# Patient Record
Sex: Female | Born: 1960 | Race: White | Hispanic: No | Marital: Married | Smoking: Former smoker
Health system: Southern US, Community
[De-identification: ages and names within clinical notes are randomized; demographics above are authoritative.]

## PROBLEM LIST (undated history)

## (undated) DIAGNOSIS — I10 Essential (primary) hypertension: Secondary | ICD-10-CM

## (undated) HISTORY — PX: TONSILLECTOMY: SUR1361

## (undated) HISTORY — DX: Essential (primary) hypertension: I10

---

## 2011-07-27 ENCOUNTER — Ambulatory Visit: Payer: Self-pay | Admitting: Family Medicine

## 2012-07-04 LAB — HM PAP SMEAR: HM Pap smear: NORMAL

## 2013-05-22 ENCOUNTER — Encounter: Payer: Self-pay | Admitting: Adult Health

## 2013-05-22 ENCOUNTER — Ambulatory Visit: Payer: Self-pay | Admitting: Adult Health

## 2013-05-22 ENCOUNTER — Ambulatory Visit (INDEPENDENT_AMBULATORY_CARE_PROVIDER_SITE_OTHER): Payer: BC Managed Care – PPO | Admitting: Adult Health

## 2013-05-22 VITALS — BP 136/70 | HR 98 | Temp 98.6°F | Resp 12 | Ht 64.5 in | Wt 131.5 lb

## 2013-05-22 DIAGNOSIS — F17200 Nicotine dependence, unspecified, uncomplicated: Secondary | ICD-10-CM

## 2013-05-22 DIAGNOSIS — Z1211 Encounter for screening for malignant neoplasm of colon: Secondary | ICD-10-CM | POA: Insufficient documentation

## 2013-05-22 DIAGNOSIS — Z0000 Encounter for general adult medical examination without abnormal findings: Secondary | ICD-10-CM

## 2013-05-22 DIAGNOSIS — Z1239 Encounter for other screening for malignant neoplasm of breast: Secondary | ICD-10-CM

## 2013-05-22 MED ORDER — NORETHINDRONE ACETATE 5 MG PO TABS: 5.0000 mg | ORAL_TABLET | Freq: Every day | ORAL | Status: DC

## 2013-05-22 NOTE — Assessment & Plan Note (Signed)
Refer to GI for screening colonoscopy 

## 2013-05-22 NOTE — Assessment & Plan Note (Addendum)
Smokes approximately one pack per day. She is in the process of trying to quit smoking. Discussed options available to help should she find this necessary. She is currently going through a counseling/encouraging program to help her quit. Continue to encourage smoking cessation.

## 2013-05-22 NOTE — Patient Instructions (Addendum)
   Thank you for choosing [place] [facility] for your health care needs.  Please schedule appointment to have fasting labs at your earliest convenience.  The results will be available through MyChart for your convenience. Please remember to activate this. The activation code is located at the end of this form.  I am referring you to GI for your screening colonoscopy.  I have sent in prescription to [facility] for you.  Please schedule your Mammogram for October.  Please send me information of your immunizations - tetanus, pneumonia, flu

## 2013-05-22 NOTE — Progress Notes (Signed)
Subjective:    Patient ID: Marcia Rodriguez, female    DOB: 02-14-1961, 52 y.o.   MRN: [id number]  HPI  Patient is a very pleasant 52 year old female who presents to clinic to establish care and for a routine physical. She has recently had a breast and pelvic exam by her OB/GYN. These were completed in October 2013. Patient has not had a regular PCP. Most of her care was being provided through her OB/GYN. We will request medical records. Patient is overall feeling very well. She has no concerns this visit. She is a in every day smoker and is interested in quitting. She is currently undergoing counseling for smoking cessation.   History reviewed. No pertinent past medical history.   History reviewed. No pertinent past surgical history.   Family History  Problem Relation Age of Onset  . COPD Mother   . Heart disease Father 43    Died heart dz  . Diabetes Brother      History   Social History  . Marital Status: Married    Spouse Name: Latausha Flamm    Number of Children: 2  . Years of Education: 12   Occupational History  . Triage Nurse     [employer]   Social History Main Topics  . Smoking status: Current Every Day Smoker -- 1.00 packs/day for 37 years    Types: Cigarettes  . Smokeless tobacco: Never Used  . Alcohol Use: 8.4 oz/week    14 Cans of beer per week     Comment: 2 drinks daily  . Drug Use: No  . Sexual Activity: Not on file   Other Topics Concern  . Not on file   Social History Narrative   Kendall grew up in [place]. She is married. They have 2 daughters Mare Loan, age 75; Carollee Herter, age 78). They also have a grandson (Braden age 39). She lives at home with husband, Channing Mutters, of 32 years and their daughter Carollee Herter and grandson, Braden They have a boxer named Duke. 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 Elzina enjoys traveling and playing with her grandson.     Review of Systems  Constitutional: Negative.   HENT: Negative.   Eyes:  Negative.        Yearly eye exam. Last exam 2013.  Respiratory: Negative.   Cardiovascular: Negative.   Gastrointestinal: Negative.   Endocrine: Negative.   Genitourinary: Negative.   Musculoskeletal: Negative.   Skin: Negative.   Allergic/Immunologic:       Mild seasonal allergies  Neurological: Negative.   Hematological: Negative.   Psychiatric/Behavioral: Negative.     BP 136/70  Pulse 98  Temp(Src) 98.6 F (37 C) (Oral)  Resp 12  Ht 5' 4.5" (1.638 m)  Wt 131 lb 8 oz (59.648 kg)  BMI 22.23 kg/m2  SpO2 97%     Objective:   Physical Exam  Constitutional: She is oriented to person, place, and time. She appears well-developed and well-nourished. No distress.  HENT:  Head: Normocephalic and atraumatic.  Right Ear: External ear normal.  Left Ear: External ear normal.  Nose: Nose normal.  Mouth/Throat: Oropharynx is clear and moist.  Eyes: Conjunctivae and EOM are normal. Pupils are equal, round, and reactive to light.  Neck: Normal range of motion. Neck supple. No tracheal deviation present. No thyromegaly present.  Cardiovascular: Normal rate, regular rhythm, normal heart sounds and intact distal pulses.  Exam reveals no gallop and no friction rub.   No murmur heard. Pulmonary/Chest:  Effort normal and breath sounds normal. No respiratory distress. She has no wheezes. She has no rales. She exhibits no tenderness.  Abdominal: Soft. She exhibits no distension and no mass. There is no tenderness. There is no rebound and no guarding.  Musculoskeletal: Normal range of motion. She exhibits no edema and no tenderness.  Lymphadenopathy:    She has no cervical adenopathy.  Neurological: She is alert and oriented to person, place, and time. She has normal reflexes. She displays no tremor and normal reflexes. No cranial nerve deficit. She exhibits normal muscle tone. She displays a negative Romberg sign. Coordination and gait normal.  Reflex Scores:      Brachioradialis reflexes are  2+ on the right side and 2+ on the left side.      Patellar reflexes are 2+ on the right side and 2+ on the left side. Skin: Skin is warm and dry.  Psychiatric: She has a normal mood and affect. Her behavior is normal. Judgment and thought content normal.       Assessment & Plan:

## 2013-05-22 NOTE — Assessment & Plan Note (Signed)
Normal physical exam excluding breast and pelvic. Labs: CBC with differential, basic metabolic panel, hepatic panel, lipids, TSH, vitamin D, and B12. Mammography due in October. Screening colonoscopy - refer to GI.

## 2013-05-22 NOTE — Assessment & Plan Note (Signed)
Mammogram scheduled for October 2014. Patient will call to schedule her own appointment.

## 2013-05-28 ENCOUNTER — Other Ambulatory Visit: Payer: BC Managed Care – PPO

## 2013-07-03 ENCOUNTER — Telehealth: Payer: Self-pay | Admitting: Adult Health

## 2013-07-03 NOTE — Telephone Encounter (Signed)
Left message for pt, advising Rx was sent 05/22/13 with a year of refills to [facility] and to check with them regarding her refill, telephone number given.

## 2013-07-03 NOTE — Telephone Encounter (Signed)
Pt called to get refill on Norethindrone acet tab 5mg  [facility] Pt has about 1 week left Pt didn't know number [facility]

## 2013-08-08 ENCOUNTER — Ambulatory Visit: Payer: Self-pay | Admitting: Gastroenterology

## 2013-09-01 ENCOUNTER — Encounter: Payer: Self-pay | Admitting: Adult Health

## 2014-06-05 ENCOUNTER — Other Ambulatory Visit: Payer: Self-pay | Admitting: Adult Health

## 2014-06-09 ENCOUNTER — Telehealth: Payer: Self-pay

## 2014-06-09 NOTE — Telephone Encounter (Signed)
Patient called requesting a Rx refill for Aygestin. Patient last seen in office 05/22/13. She has appt schedule to see Dr. Darrick Huntsman 07/24/14 for a physical but she will be out of medication before that appt. Is it ok to refill? Please advise.

## 2014-06-10 ENCOUNTER — Other Ambulatory Visit: Payer: Self-pay | Admitting: *Deleted

## 2014-06-10 MED ORDER — NORETHINDRONE ACETATE 5 MG PO TABS: 5.0000 mg | ORAL_TABLET | Freq: Every day | ORAL | Status: AC

## 2014-06-10 NOTE — Telephone Encounter (Signed)
Refill only to cover until appointment. Thanks

## 2014-07-24 ENCOUNTER — Encounter: Payer: BC Managed Care – PPO | Admitting: Internal Medicine

## 2015-03-02 ENCOUNTER — Ambulatory Visit
Admission: RE | Admit: 2015-03-02 | Discharge: 2015-03-02 | Disposition: A | Discharge status: Home / Self Care | Payer: BLUE CROSS/BLUE SHIELD | Source: Ambulatory Visit | Attending: Hematology and Oncology | Admitting: Hematology and Oncology

## 2015-03-02 ENCOUNTER — Ambulatory Visit: Payer: BLUE CROSS/BLUE SHIELD

## 2015-03-02 ENCOUNTER — Inpatient Hospital Stay: Payer: BLUE CROSS/BLUE SHIELD | Attending: Hematology and Oncology | Admitting: Hematology and Oncology

## 2015-03-02 ENCOUNTER — Other Ambulatory Visit: Payer: Self-pay

## 2015-03-02 ENCOUNTER — Encounter (INDEPENDENT_AMBULATORY_CARE_PROVIDER_SITE_OTHER): Payer: Self-pay

## 2015-03-02 ENCOUNTER — Inpatient Hospital Stay: Payer: BLUE CROSS/BLUE SHIELD

## 2015-03-02 VITALS — BP 134/90 | HR 89 | Temp 95.5°F | Ht 64.0 in | Wt 137.3 lb

## 2015-03-02 DIAGNOSIS — D751 Secondary polycythemia: Secondary | ICD-10-CM | POA: Insufficient documentation

## 2015-03-02 DIAGNOSIS — F17200 Nicotine dependence, unspecified, uncomplicated: Secondary | ICD-10-CM | POA: Diagnosis present

## 2015-03-02 DIAGNOSIS — F17210 Nicotine dependence, cigarettes, uncomplicated: Secondary | ICD-10-CM | POA: Diagnosis not present

## 2015-03-02 DIAGNOSIS — Z79899 Other long term (current) drug therapy: Secondary | ICD-10-CM | POA: Diagnosis not present

## 2015-03-02 DIAGNOSIS — I10 Essential (primary) hypertension: Secondary | ICD-10-CM | POA: Insufficient documentation

## 2015-03-02 DIAGNOSIS — R05 Cough: Secondary | ICD-10-CM | POA: Diagnosis present

## 2015-03-02 LAB — CBC WITH DIFFERENTIAL/PLATELET
Basophils Absolute: 0 10*3/uL (ref 0–0.1)
Basophils Relative: 1 %
Eosinophils Absolute: 0.2 10*3/uL (ref 0–0.7)
Eosinophils Relative: 2 %
HCT: 48.6 % — ABNORMAL HIGH (ref 35.0–47.0)
Hemoglobin: 16 g/dL (ref 12.0–16.0)
Lymphocytes Relative: 31 %
Lymphs Abs: 2.6 10*3/uL (ref 1.0–3.6)
MCH: 31.9 pg (ref 26.0–34.0)
MCHC: 32.8 g/dL (ref 32.0–36.0)
MCV: 97.1 fL (ref 80.0–100.0)
Monocytes Absolute: 0.7 10*3/uL (ref 0.2–0.9)
Monocytes Relative: 8 %
Neutro Abs: 4.8 10*3/uL (ref 1.4–6.5)
Neutrophils Relative %: 58 %
Platelets: 217 10*3/uL (ref 150–440)
RBC: 5.01 MIL/uL (ref 3.80–5.20)
RDW: 13.6 % (ref 11.5–14.5)
WBC: 8.3 10*3/uL (ref 3.6–11.0)

## 2015-03-02 LAB — URINALYSIS COMPLETE WITH MICROSCOPIC (ARMC ONLY)
Bacteria, UA: NONE SEEN
Bilirubin Urine: NEGATIVE
Glucose, UA: NEGATIVE mg/dL
Ketones, ur: NEGATIVE mg/dL
Leukocytes, UA: NEGATIVE
Nitrite: NEGATIVE
Protein, ur: NEGATIVE mg/dL
Specific Gravity, Urine: 1.012 (ref 1.005–1.030)
pH: 6 (ref 5.0–8.0)

## 2015-03-02 NOTE — Progress Notes (Signed)
Pt here today for initial consult referred by Dr. Thedore MinsSingh for increased HGB; offers no complaints

## 2015-03-03 LAB — MISC LABCORP TEST (SEND OUT)
LabCorp test name: 7187
Labcorp test code: 7187
Source (LabCorp): 7187

## 2015-03-03 LAB — ERYTHROPOIETIN: Erythropoietin: 8.3 m[IU]/mL (ref 2.6–18.5)

## 2015-03-08 LAB — MISC LABCORP TEST (SEND OUT)

## 2015-03-16 ENCOUNTER — Inpatient Hospital Stay: Payer: BLUE CROSS/BLUE SHIELD | Admitting: Hematology and Oncology

## 2015-03-16 ENCOUNTER — Encounter: Payer: Self-pay | Admitting: Hematology and Oncology

## 2015-03-16 NOTE — Progress Notes (Signed)
[facility]-  Cancer Center  Clinic day:  03/02/2015  Chief Complaint: Marcia Rodriguez is a 54 y.o. female with an elevated hemoglobin who is referred in consultation by Dr. Thedore Mins assessment and management.  HPI: Patient has been followed by Dr. Thedore Mins for the past 1-2 years. She has a 1 pack per day smoking history for the past 30 years. She want to cut back. She has not had a chest x-ray.  She is not sure if she may have sleep apnea. She snores. Sometimes she feels well rested in the morning.   She denies any cardiac problems.  She has never had an echocardiogram. She denies any limitations on exercise. She does not take any herbal or hormonal suppliments including testosterone.   Labs on 02/10/2015 revealed a hematocrit of 49.9, hemoglobin 16.6, MCV 99, platelets 260,000, white count 6200 with an ANC of 3270. Labs from 11/18/2014 included a hematocrit of 49.1, hemoglobin 16.2, and MCV 98.6. Comprehensive metabolic panel was normal.  Iron studies on 02/10/2015 revealed a ferritin of 38 (slightly low) and TIBC of 485 (slightly elevated).  Symptomatically, she feels good.  Diet is normal. She states that she eats meat daily. Her only issue is if she turns her head fast. She denies any erythromelalgia.  Past Medical History  Diagnosis Date  . Hypertension     Past Surgical History  Procedure Laterality Date  . Cesarean section    . Tonsillectomy      Family History  Problem Relation Age of Onset  . COPD Mother   . Heart disease Father 53    Died heart dz  . Diabetes Brother     Social History:  reports that she has been smoking Cigarettes.  She has a 37 pack-year smoking history. She has never used smokeless tobacco. She reports that she drinks about 8.4 oz of alcohol per week. She reports that she does not use illicit drugs.  The patient is alone today.  Allergies: No Known Allergies  Current Medications: Current Outpatient Prescriptions  Medication Sig  Dispense Refill  . amLODipine (NORVASC) 5 MG tablet Take by mouth.    . Aspirin-Salicylamide-Caffeine (BC HEADACHE POWDER PO) Take by mouth as needed.    Marland Kitchen estradiol (CLIMARA - DOSED IN MG/24 HR) 0.025 mg/24hr patch Place onto the skin.    Marland Kitchen norethindrone (AYGESTIN) 5 MG tablet Take 1 tablet (5 mg total) by mouth daily. For 3 weeks each month 42 tablet 0   No current facility-administered medications for this visit.    Review of Systems:  GENERAL:  Feels good.  Active.  No fevers, sweats or weight loss. PERFORMANCE STATUS (ECOG):  0 HEENT:  No visual changes, runny nose, sore throat, mouth sores or tenderness. Lungs: No shortness of breath or cough.  No hemoptysis. Cardiac:  No chest pain, palpitations, orthopnea, or PND. GI:  No nausea, vomiting, diarrhea, constipation, melena or hematochezia. GU:  No urgency, frequency, dysuria, or hematuria. Musculoskeletal:  No back pain.  No joint pain.  No muscle tenderness. Extremities:  No pain or swelling. Skin:  No rashes or skin changes. Neuro:  No headache, numbness or weakness, balance or coordination issues. Endocrine:  No diabetes, thyroid issues, hot flashes or night sweats. Psych:  No mood changes, depression or anxiety. Pain:  No focal pain. Review of systems:  All other systems reviewed and found to be negative.   Physical Exam: Blood pressure 134/90, pulse 89, temperature 95.5 F (35.3 C), temperature source Tympanic, height  5\' 4"  (1.626 m), weight 137 lb 5.6 oz (62.3 kg), SpO2 94 %. GENERAL:  Well developed, well nourished, sitting comfortably in the exam room in no acute distress. MENTAL STATUS:  Alert and oriented to person, place and time. HEAD:  Long brown hair pulled back.  Normocephalic, atraumatic, face symmetric, no Cushingoid features. EYES:  Oval glasses.  Brown eyes.  Pupils equal round and reactive to light and accomodation.  No conjunctivitis or scleral icterus. ENT:  Oropharynx clear without lesion.  Tongue normal.  Mucous membranes moist.  RESPIRATORY:  Clear to auscultation without rales, wheezes or rhonchi. CARDIOVASCULAR:  Regular rate and rhythm without murmur, rub or gallop. ABDOMEN:  Soft, non-tender, with active bowel sounds, and no hepatosplenomegaly.  No masses. SKIN:  No rashes, ulcers or lesions. EXTREMITIES: No clubbing.  No edema, no skin discoloration or tenderness.  No palpable cords. LYMPH NODES: No palpable cervical, supraclavicular, axillary or inguinal adenopathy  NEUROLOGICAL: Unremarkable. PSYCH:  Appropriate.  Appointment on 03/02/2015  Component Date Value Ref Range Status  . WBC 03/02/2015 8.3  3.6 - 11.0 K/uL Final  . RBC 03/02/2015 5.01  3.80 - 5.20 MIL/uL Final  . Hemoglobin 03/02/2015 16.0  12.0 - 16.0 g/dL Final  . HCT 05/39/7673 48.6* 35.0 - 47.0 % Final  . MCV 03/02/2015 97.1  80.0 - 100.0 fL Final  . MCH 03/02/2015 31.9  26.0 - 34.0 pg Final  . MCHC 03/02/2015 32.8  32.0 - 36.0 g/dL Final  . RDW 41/93/7902 13.6  11.5 - 14.5 % Final  . Platelets 03/02/2015 217  150 - 440 K/uL Final  . Neutrophils Relative % 03/02/2015 58   Final  . Neutro Abs 03/02/2015 4.8  1.4 - 6.5 K/uL Final  . Lymphocytes Relative 03/02/2015 31   Final  . Lymphs Abs 03/02/2015 2.6  1.0 - 3.6 K/uL Final  . Monocytes Relative 03/02/2015 8   Final  . Monocytes Absolute 03/02/2015 0.7  0.2 - 0.9 K/uL Final  . Eosinophils Relative 03/02/2015 2   Final  . Eosinophils Absolute 03/02/2015 0.2  0 - 0.7 K/uL Final  . Basophils Relative 03/02/2015 1   Final  . Basophils Absolute 03/02/2015 0.0  0 - 0.1 K/uL Final  . Erythropoietin 03/02/2015 8.3  2.6 - 18.5 mIU/mL Final   Comment: (NOTE) Performed At: [facility] [address] [place], [place] [id number] Mila Homer MD JM:[id number]   . Color, Urine 03/02/2015 STRAW* YELLOW Final  . APPearance 03/02/2015 CLEAR* CLEAR Final  . Glucose, UA 03/02/2015 NEGATIVE  NEGATIVE mg/dL Final  . Bilirubin Urine 03/02/2015 NEGATIVE  NEGATIVE  Final  . Ketones, ur 03/02/2015 NEGATIVE  NEGATIVE mg/dL Final  . Specific Gravity, Urine 03/02/2015 1.012  1.005 - 1.030 Final  . Hgb urine dipstick 03/02/2015 1+* NEGATIVE Final  . pH 03/02/2015 6.0  5.0 - 8.0 Final  . Protein, ur 03/02/2015 NEGATIVE  NEGATIVE mg/dL Final  . Nitrite 22/97/9892 NEGATIVE  NEGATIVE Final  . Leukocytes, UA 03/02/2015 NEGATIVE  NEGATIVE Final  . RBC / HPF 03/02/2015 0-5  0 - 5 RBC/hpf Final  . WBC, UA 03/02/2015 0-5  0 - 5 WBC/hpf Final  . Bacteria, UA 03/02/2015 NONE SEEN  NONE SEEN Final  . Squamous Epithelial / LPF 03/02/2015 0-5* NONE SEEN Final  . [facility] test code 03/02/2015 7187   Final  . [facility] test name 03/02/2015 7187   Final  . Source ([facility]) 03/02/2015 7187   Corrected  . Misc [facility] result 03/02/2015 COMMENT  Final   Comment: (NOTE) Test Ordered: [id number] Carbon Monoxide, Blood Carbon Monoxide, Blood         7.3         [H ] %        BN     Reference Range: 0.0-1.9                                                          Environmental Exposure:                             Nonsmokers           <2.0                             Smokers              <9.0                            Occupational Exposure:                             BEI                   3.5                                Detection Limit =  0.2 Performed At: [facility] [address] [place], [place] [id number] Mila Homer MD BJ:[id number]   . Misc [facility] result 03/02/2015 COMMENT   Final   Comment: (NOTE) Test Ordered: [id number] JAK2 V617F Qual, Rfx/Exon 12 JAK2 V617F mutation detection  Comment                   YU   Result: NEGATIVE for the JAK2 V617F mutation. Interpretation:  The G to T nucleotide change encoding the V617F mutation was not detected.  This result does not rule out the presence of the JAK2 mutation at a level below the sensitivity of detection of this assay, or the presence of other mutations within JAK2 not detected by this assay.  This  result does not rule out a diagnosis of polycythemia vera, essential thrombocythemia or idiopathic myelofibrosis as the V617F mutation is not detected in all patients with these disorders. Background:                    Comment                   TG   JAK2 is a cytoplasmic tyrosine kinase with a key role in signal transduction from multiple hematopoietic growth factor receptors. A point mutation within exon 14 of the JAK2 gene ([id number]) encoding a valine to phenylalanine substitution at position 617 of the JAK2 prot                          ein (V617F) has been identified in most patients with polycythemia vera, and in about half of those with either essential thrombocythemia or idiopathic myelofibrosis.  The V617F has also been  detected, although infrequently, in other myeloid disorders such as chronic myelomonocytic leukemia and chronic neutrophilic leukemia. V617F is an acquired mutation that alters a highly conserved valine present in the negative regulatory JH2 domain of the JAK2 protein and is predicted to dysregulate kinase activity. Methodology: Genomic DNA was purified from the provided specimen.  Allele- specific PCR using fluorescent primers was used to simultaneously amplify both the wild type and mutant alleles. Amplification products were analyzed by capillary electrophoresis.  This assay has a sensitivity to detect approximately a 5% population of cells containing the V617F mutation in a background of non-mutant cells. Reference: Tefferi A and Gilliland DG.  The JAK2 V617F Tyrosine Kinase M                          utation in Myeloproliferative  Disorders:  Status Report and Immediate Implications for Disease Classification and Diagnosis. Mayo Clin Proc 2005;80(7):947-958. Director Review:               Comment                   YU   Erskine Speed MS, PhD, [facility] Technical Director [facility] for Molecular Biology and Pathology Research [place],  [place] [phone] Reflex:                        Comment                   YU   Reflex to JAK2 Exon 12 Mutation Analysis is indicated. JAK2 Exon 12 Analysis Result:  Comment                   YU   NEGATIVE for the JAK2 exon 12 mutations. Interpretation: JAK2 exon 12 mutations were not detected. This result does not rule out the presence of the JAK2 mutation at a level below the sensitivity of detection of this assay, or the presence of other mutations within JAK2 not detected by this assay. This result does not rule out a diagnosis of polycythemia vera, essential thrombocythemia or idiopathic myelofibrosis as JAK2 exon 12 mut                          ations are not detected in all patients with these disorders. Background:                    Comment                   TG   JAK2 is a cytoplasmic tyrosine kinase with a key role in signal transduction in conjunction with multiple hematopoietic growth factor receptors. JAK2 mutation analysis can be used in conjunction with bone marrow histology and cytogenetic analysis to assist in the diagnosis of myeloproliferative disorders (MPDs) such as polycythemia vera, essential thrombocythemia, and idiopathic myelofibrosis. The JAK2 V617F (exon 14) mutation analysis can provide information helpful in distinguishing MPDs from from reactive conditions, particularly secondary thrombocytosis and erythrocytosis. Because JAK2 exon 12 mutation status is associated with erythrocytosis and atypical bone marrow morphology, mutational analysis may be used to differentiate the reactive conditions from the malignant erythrocytosis. Peripheral blood mutation screening cannot substitute for                           bone marrow histology as JAK2 mutations are not always  detected in patients with polycythemia vera and is absent in almost half of patients with essential thrombocythemia and idiopathic myelofibrosis. JAK2 Exon 12 mutations detected by this  assay: [id number], [id number], [id number], [id number]. Methodology: Genomic DNA was purified from blood or bone marrow specimen. Allele-specific PCR using fluorescent primers was used to amplify both the wild type and mutant alleles. Amplification products were analyzed by capillary electrophoresis. Reference: 1. Tefferi A. JAK2 Mutations in Polycythemia Vera-Molecular   Mechanisms and Clinical Applications. N Engl J Med. 2007;   356(5):443-444. 2. Scott LM. et al. JAK2 Exon 12 Mutations in Polycythemia   Vera and Idiopathic Erythrocytosis. N Engl J Med. 2007;   356(5):459-468. Director Review:               Comment                   YU   Erskine Speed MS, PhD, [facility] Technical Director [facility] for Molecular Biology a                          nd Pathology Research [place], [place] [phone] Performed At: [facility] [address] [place], [place] [id number] Mila Homer MD VH:[id number] Performed At: [facility] [address] Suite [place], [place] [id number] Oley Balm MD UV:[id number] Performed At: [facility] [address] [place], [place] [id number] Oley Balm MD VF:[id number]     Assessment:  Marcia Rodriguez is a 54 y.o. female with a history of erythrocytosis since 10/2004.  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 She has a 30-pack-year smoking history. She may have sleep apnea. She denies any cardiac issues. She is not on any hormonal supplementation.  Symptomatically, she feels well. Exam is unremarkable.  Plan: 1. Review differential diagnoses of a polycythemia. Discuss smoking, cardiac abnormalities, sleep apnea, erythropoietin producing tumors and  myeloproliferative disorders.  Discuss work-up in women for hemoglobin > 16.5 and hematocrit > 48.  Suspect etiology is due to smoking. 2. Labs today:  CBC with diff, epo level, JAK2 with reflex testing, carboxyhemoglobin. 3. CXR (PA and  lateral) 4. Urinalysis- r/o hematuria. 5. Discuss smoking cessation. 6. Consider sleep apnea testing. 7. RTC in 2 weeks for review of work-up.   Rosey Bath, MD

## 2015-03-21 ENCOUNTER — Telehealth: Payer: Self-pay | Admitting: Hematology and Oncology

## 2015-03-21 NOTE — Telephone Encounter (Signed)
Re:  Work-up  The patient was called regarding her workup of erythrocytosis. Workup suggests her erythrocytosis is secondary due to her smoking. CXR was normal.  JAK2 testing as well as JAK2 exon 12 was negative. Her carboxyhemoglobin was elevated at 7.3%.  Erythropoietin level was normal at 8.3. Urinalysis revealed 1+ hemoglobin.  I reviewed with the patient all available labs. I discussed smoking cessation. She stated that she is attending smoking cessation classes through her husband's work. I discussed sleep apnea testing.  I also encouraged her to have follow-up on the 1+ blood in her urine to ensure this has resolved.  She should have follow-up blood counts.    Marcia Bath, MD

## 2015-03-25 ENCOUNTER — Telehealth: Payer: Self-pay

## 2015-03-25 NOTE — Telephone Encounter (Signed)
Left voicemail  And pt called back; reviewed carbon monoxide results as requested by Dr. Merlene Pullingorcoran; pt does not wish to proceed with phlebotomy at this time; will contact us is she decides to proceed

## 2017-03-01 IMAGING — DX DG CHEST 2V
2 series · 2 of 2 positions shown · non-contrast
Comparison: None

CLINICAL DATA: Smoking, polycythemia and cough.

EXAM:
CHEST - 2 VIEW

[chest pa]
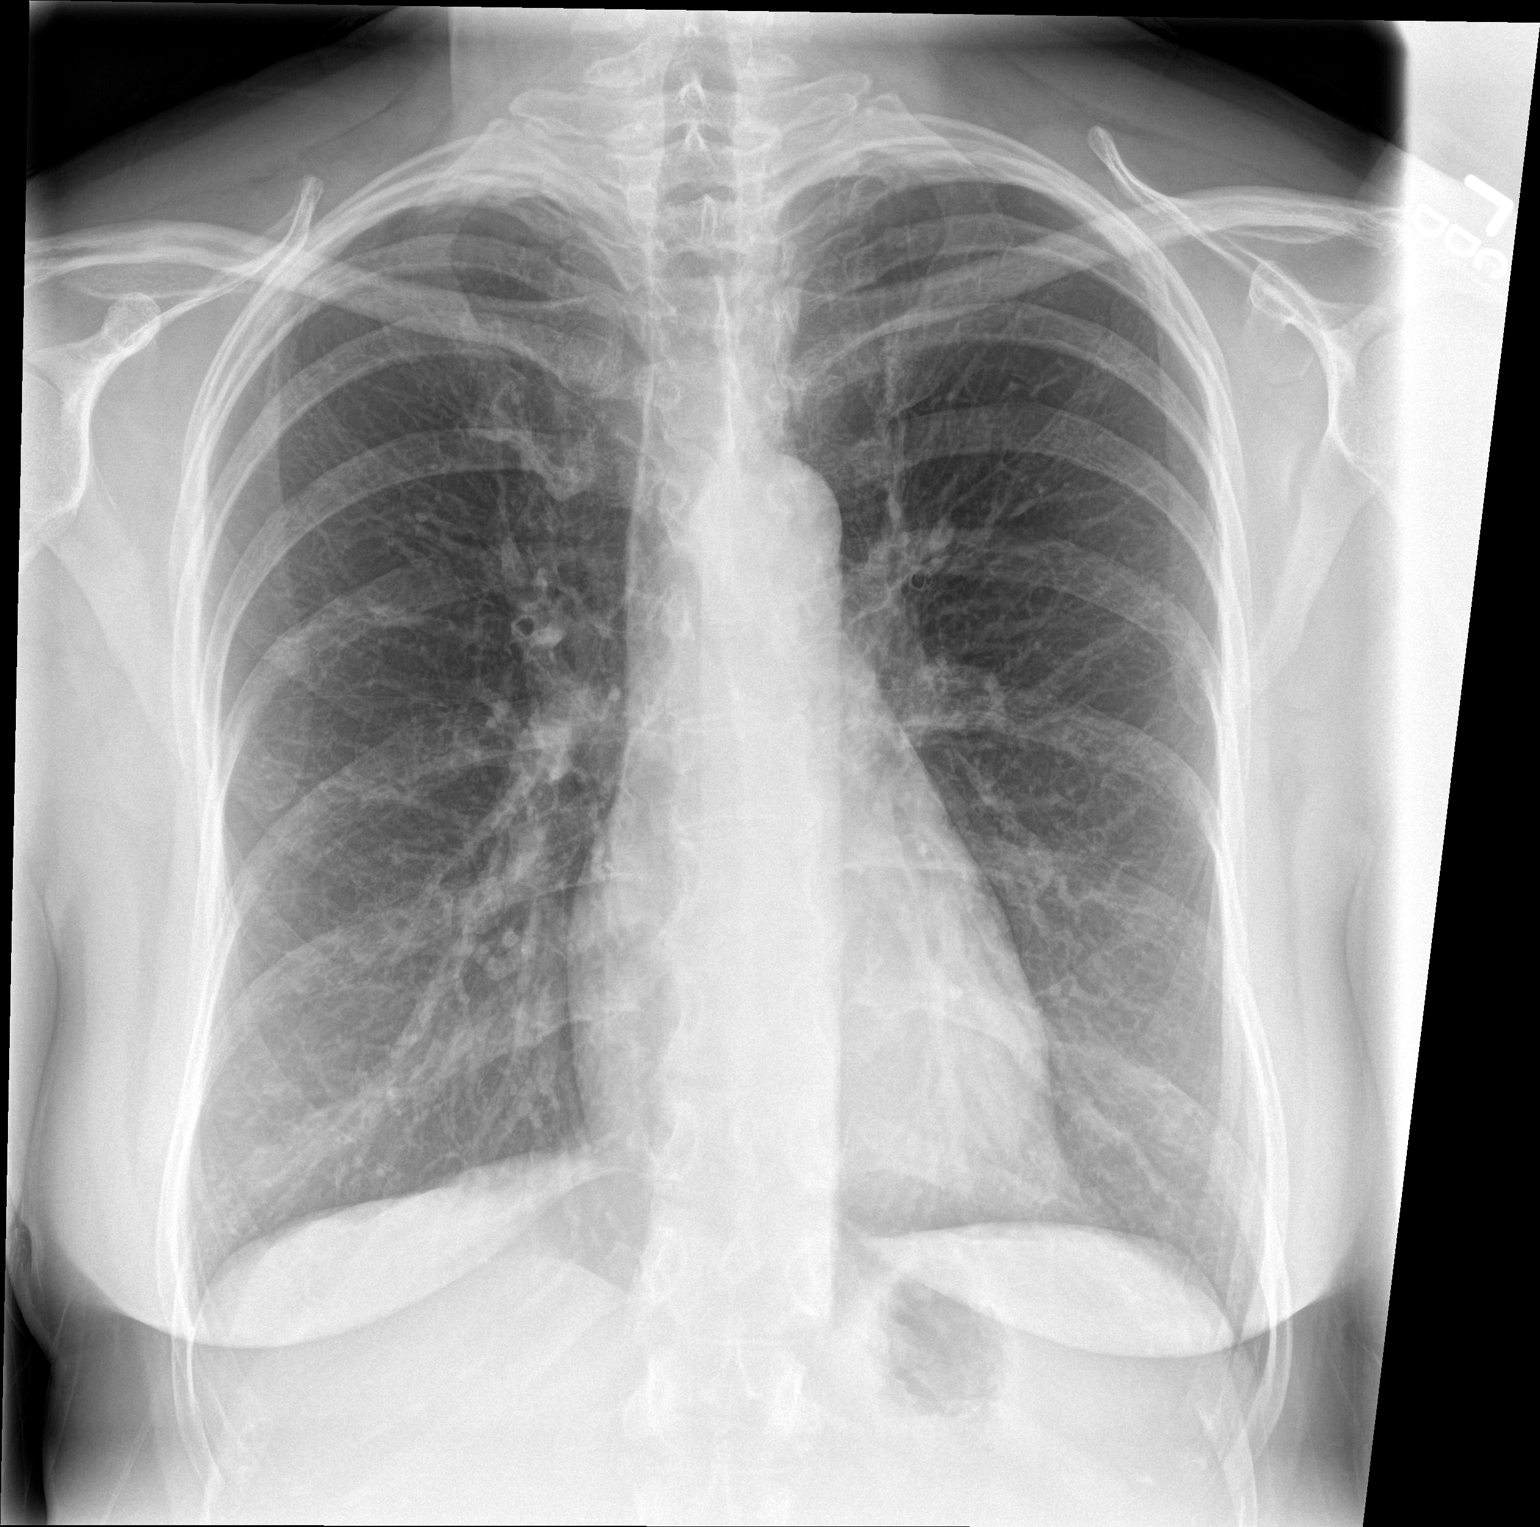

[chest lat]
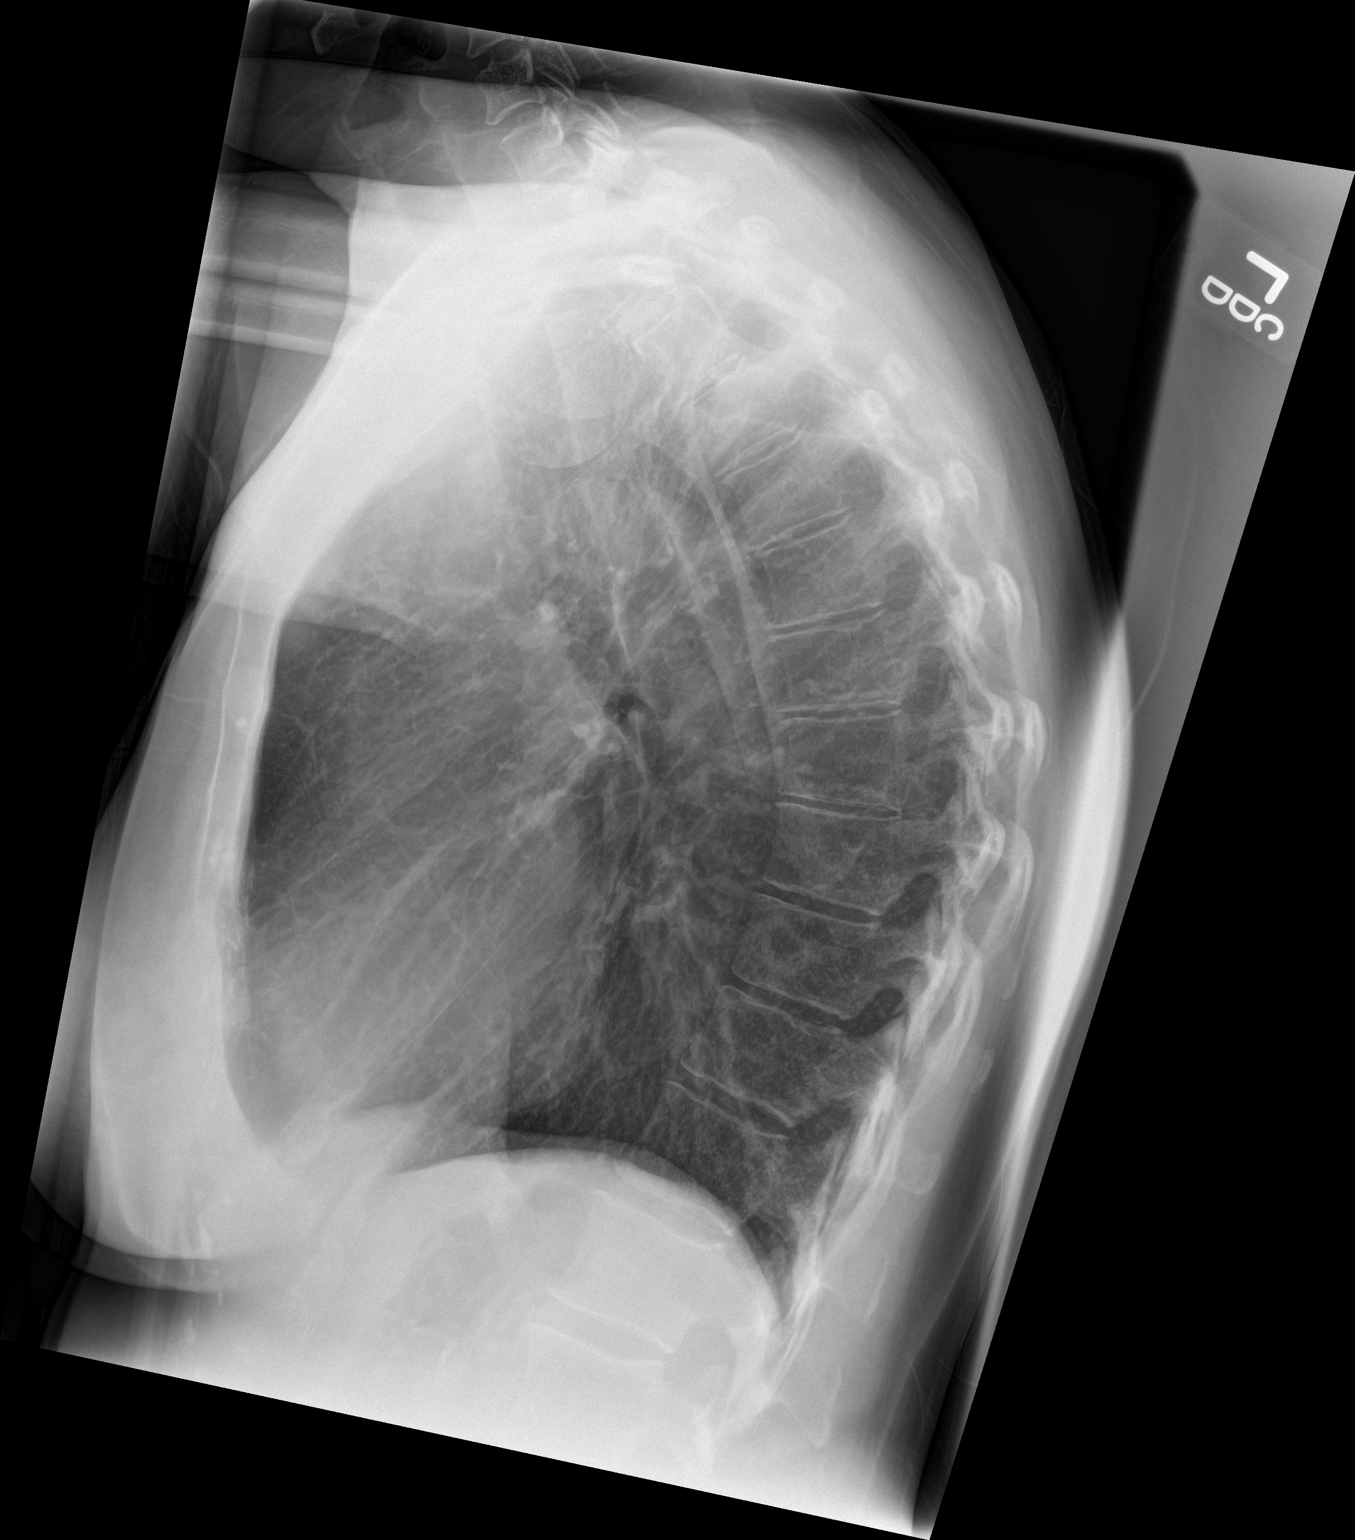

[2 of 2 positions shown; findings below may reference images not displayed]

FINDINGS: The heart size and mediastinal contours are within normal limits.
There is no evidence of pulmonary edema, consolidation,
pneumothorax, nodule or pleural fluid. The visualized skeletal
structures are unremarkable.
IMPRESSION: No active disease.

## 2017-10-26 ENCOUNTER — Other Ambulatory Visit: Payer: Self-pay | Admitting: Internal Medicine

## 2017-10-29 ENCOUNTER — Other Ambulatory Visit: Payer: Self-pay | Admitting: Internal Medicine

## 2017-10-29 DIAGNOSIS — Z1239 Encounter for other screening for malignant neoplasm of breast: Secondary | ICD-10-CM

## 2018-04-29 ENCOUNTER — Encounter (INDEPENDENT_AMBULATORY_CARE_PROVIDER_SITE_OTHER): Payer: Self-pay

## 2022-03-02 ENCOUNTER — Other Ambulatory Visit: Payer: Self-pay | Admitting: *Deleted

## 2022-03-02 DIAGNOSIS — Z87891 Personal history of nicotine dependence: Secondary | ICD-10-CM

## 2022-03-02 DIAGNOSIS — Z122 Encounter for screening for malignant neoplasm of respiratory organs: Secondary | ICD-10-CM

## 2022-04-11 ENCOUNTER — Encounter: Payer: Self-pay | Admitting: Acute Care

## 2022-04-11 ENCOUNTER — Ambulatory Visit (INDEPENDENT_AMBULATORY_CARE_PROVIDER_SITE_OTHER): Payer: BC Managed Care – PPO | Admitting: Acute Care

## 2022-04-11 DIAGNOSIS — Z87891 Personal history of nicotine dependence: Secondary | ICD-10-CM | POA: Diagnosis not present

## 2022-04-11 NOTE — Progress Notes (Signed)
Virtual Visit via Telephone Note  I connected with Marcia Rodriguez on 04/11/22 at  4:00 PM EDT by telephone and verified that I am speaking with the correct person using two identifiers.  Location: Patient:  At home Provider: [address], [place], [place], [address]    I discussed the limitations, risks, security and privacy concerns of performing an evaluation and management service by telephone and the availability of in person appointments. I also discussed with the patient that there may be a patient responsible charge related to this service. The patient expressed understanding and agreed to proceed.     Shared Decision Making Visit Lung Cancer Screening Program [phone])   Eligibility: Age 61 y.o. Pack Years Smoking History Calculation 60 pack year smoking history (# packs/per year x # years smoked) Recent History of coughing up blood  no Unexplained weight loss? no ( >Than 15 pounds within the last 6 months ) Prior History Lung / other cancer no (Diagnosis within the last 5 years already requiring surveillance chest CT Scans). Smoking Status Former Smoker Former Smokers: Years since quit: 3 years  Quit Date: 2020  Visit Components: Discussion included one or more decision making aids. yes Discussion included risk/benefits of screening. yes Discussion included potential follow up diagnostic testing for abnormal scans. yes Discussion included meaning and risk of over diagnosis. yes Discussion included meaning and risk of False Positives. yes Discussion included meaning of total radiation exposure. yes  Counseling Included: Importance of adherence to annual lung cancer LDCT screening. yes Impact of comorbidities on ability to participate in the program. yes Ability and willingness to under diagnostic treatment. yes  Smoking Cessation Counseling: Current Smokers:  Discussed importance of smoking cessation. yes Information about tobacco cessation classes and  interventions provided to patient. yes Patient provided with "ticket" for LDCT Scan. yes Symptomatic Patient. no  Counseling NA Diagnosis Code: Tobacco Use Z72.0 Asymptomatic Patient yes  Counseling (Intermediate counseling: > three minutes counseling) E3329 Former Smokers:  Discussed the importance of maintaining cigarette abstinence. yes Diagnosis Code: Personal History of Nicotine Dependence. J18.841 Information about tobacco cessation classes and interventions provided to patient. Yes Patient provided with "ticket" for LDCT Scan. yes Written Order for Lung Cancer Screening with LDCT placed in Epic. Yes (CT Chest Lung Cancer Screening Low Dose W/O CM) [id number] Z12.2-Screening of respiratory organs Z87.891-Personal history of nicotine dependence  I spent 25 minutes of face to face time/virtual visit time  with  Marcia Rodriguez discussing the risks and benefits of lung cancer screening. We took the time to pause the power point at intervals to allow for questions to be asked and answered to ensure understanding. We discussed that she had taken the single most powerful action possible to decrease her risk of developing lung cancer when she quit smoking. I counseled her to remain smoke free, and to contact me if she ever had the desire to smoke again so that I can provide resources and tools to help support the effort to remain smoke free. We discussed the time and location of the scan, and that either  Marcia Miyamoto RN, Marcia Lemon, RN or I  or I will call / send a letter with the results within  24-72 hours of receiving them. She has the office contact information in the event she needs to speak with me,  she verbalized understanding of all of the above and had no further questions upon leaving the office.     I explained to the patient that  there has been a high incidence of coronary artery disease noted on these exams. I explained that this is a non-gated exam therefore degree or severity  cannot be determined. This patient is on statin therapy. I have asked the patient to follow-up with their PCP regarding any incidental finding of coronary artery disease and management with diet or medication as they feel is clinically indicated. The patient verbalized understanding of the above and had no further questions.     Marcia Ngo, NP 04/11/2022

## 2022-04-11 NOTE — Patient Instructions (Signed)
Thank you for participating in the [facility]. It was our pleasure to meet you today. We will call you with the results of your scan within the next few days. Your scan will be assigned a Lung RADS category score by the physicians reading the scans.  This Lung RADS score determines follow up scanning.  See below for description of categories, and follow up screening recommendations. We will be in touch to schedule your follow up screening annually or based on recommendations of our providers. We will fax a copy of your scan results to your Primary Care Physician, or the physician who referred you to the program, to ensure they have the results. Please call the office if you have any questions or concerns regarding your scanning experience or results.  Our office number is [phone]. Please speak with Denise Phelps, RN. , or  Denise Buckner RN, They are  our Lung Cancer Screening RN.'s If They are unavailable when you call, Please leave a message on the voice mail. We will return your call at our earliest convenience.This voice mail is monitored several times a day.  Remember, if your scan is normal, we will scan you annually as long as you continue to meet the criteria for the program. (Age 55-77, Current smoker or smoker who has quit within the last 15 years). If you are a smoker, remember, quitting is the single most powerful action that you can take to decrease your risk of lung cancer and other pulmonary, breathing related problems. We know quitting is hard, and we are here to help.  Please let us know if there is anything we can do to help you meet your goal of quitting. If you are a former smoker, congratulations. We are proud of you! Remain smoke free! Remember you can refer friends or family members through the number above.  We will screen them to make sure they meet criteria for the program. Thank you for helping us take better care of you by  participating in Lung Screening.  You can receive free nicotine replacement therapy ( patches, gum or mints) by calling 1-800-QUIT NOW. Please call so we can get you on the path to becoming  a non-smoker. I know it is hard, but you can do this!  Lung RADS Categories:  Lung RADS 1: no nodules or definitely non-concerning nodules.  Recommendation is for a repeat annual scan in 12 months.  Lung RADS 2:  nodules that are non-concerning in appearance and behavior with a very low likelihood of becoming an active cancer. Recommendation is for a repeat annual scan in 12 months.  Lung RADS 3: nodules that are probably non-concerning , includes nodules with a low likelihood of becoming an active cancer.  Recommendation is for a 6-month repeat screening scan. Often noted after an upper respiratory illness. We will be in touch to make sure you have no questions, and to schedule your 6-month scan.  Lung RADS 4 A: nodules with concerning findings, recommendation is most often for a follow up scan in 3 months or additional testing based on our provider's assessment of the scan. We will be in touch to make sure you have no questions and to schedule the recommended 3 month follow up scan.  Lung RADS 4 B:  indicates findings that are concerning. We will be in touch with you to schedule additional diagnostic testing based on our provider's  assessment of the scan.  Other options for assistance in smoking cessation (   As covered by your insurance benefits)  Hypnosis for smoking cessation  [employer] [phone]  Acupuncture for smoking cessation  [facility] [phone]   

## 2022-04-12 ENCOUNTER — Ambulatory Visit
Admission: RE | Admit: 2022-04-12 | Discharge: 2022-04-12 | Disposition: A | Discharge status: Home / Self Care | Payer: BC Managed Care – PPO | Source: Ambulatory Visit | Attending: Acute Care | Admitting: Acute Care

## 2022-04-12 DIAGNOSIS — Z87891 Personal history of nicotine dependence: Secondary | ICD-10-CM | POA: Insufficient documentation

## 2022-04-12 DIAGNOSIS — Z122 Encounter for screening for malignant neoplasm of respiratory organs: Secondary | ICD-10-CM | POA: Diagnosis present

## 2022-04-14 ENCOUNTER — Other Ambulatory Visit: Payer: Self-pay

## 2022-04-14 DIAGNOSIS — Z87891 Personal history of nicotine dependence: Secondary | ICD-10-CM

## 2022-04-14 DIAGNOSIS — Z122 Encounter for screening for malignant neoplasm of respiratory organs: Secondary | ICD-10-CM

## 2023-02-28 ENCOUNTER — Other Ambulatory Visit: Payer: Self-pay | Admitting: Acute Care

## 2023-02-28 DIAGNOSIS — Z122 Encounter for screening for malignant neoplasm of respiratory organs: Secondary | ICD-10-CM

## 2023-02-28 DIAGNOSIS — Z87891 Personal history of nicotine dependence: Secondary | ICD-10-CM

## 2023-04-16 ENCOUNTER — Ambulatory Visit: Payer: BC Managed Care – PPO

## 2023-08-15 ENCOUNTER — Ambulatory Visit: Payer: BC Managed Care – PPO

## 2023-08-15 DIAGNOSIS — K5730 Diverticulosis of large intestine without perforation or abscess without bleeding: Secondary | ICD-10-CM | POA: Diagnosis not present

## 2023-08-15 DIAGNOSIS — D122 Benign neoplasm of ascending colon: Secondary | ICD-10-CM | POA: Diagnosis not present

## 2023-08-15 DIAGNOSIS — K640 First degree hemorrhoids: Secondary | ICD-10-CM | POA: Diagnosis not present

## 2023-08-15 DIAGNOSIS — Z1211 Encounter for screening for malignant neoplasm of colon: Secondary | ICD-10-CM | POA: Diagnosis present

## 2023-08-15 DIAGNOSIS — D123 Benign neoplasm of transverse colon: Secondary | ICD-10-CM | POA: Diagnosis not present

## 2023-10-02 ENCOUNTER — Emergency Department: Payer: BC Managed Care – PPO

## 2023-10-02 ENCOUNTER — Encounter: Payer: Self-pay | Admitting: Emergency Medicine

## 2023-10-02 ENCOUNTER — Other Ambulatory Visit: Payer: Self-pay

## 2023-10-02 ENCOUNTER — Emergency Department
Admission: EM | Admit: 2023-10-02 | Discharge: 2023-10-02 | Disposition: A | Discharge status: Home / Self Care | Payer: BC Managed Care – PPO | Attending: Emergency Medicine | Admitting: Emergency Medicine

## 2023-10-02 DIAGNOSIS — I10 Essential (primary) hypertension: Secondary | ICD-10-CM | POA: Diagnosis not present

## 2023-10-02 DIAGNOSIS — M546 Pain in thoracic spine: Secondary | ICD-10-CM | POA: Insufficient documentation

## 2023-10-02 DIAGNOSIS — T7840XA Allergy, unspecified, initial encounter: Secondary | ICD-10-CM | POA: Diagnosis not present

## 2023-10-02 DIAGNOSIS — M549 Dorsalgia, unspecified: Secondary | ICD-10-CM

## 2023-10-02 LAB — BASIC METABOLIC PANEL
Anion gap: 11 (ref 5–15)
BUN: 26 mg/dL — ABNORMAL HIGH (ref 8–23)
CO2: 24 mmol/L (ref 22–32)
Calcium: 9.4 mg/dL (ref 8.9–10.3)
Chloride: 104 mmol/L (ref 98–111)
Creatinine, Ser: 0.74 mg/dL (ref 0.44–1.00)
GFR, Estimated: >60 mL/min (ref >60)
Glucose, Bld: 134 mg/dL — ABNORMAL HIGH (ref 70–99)
Potassium: 4.1 mmol/L (ref 3.5–5.1)
Sodium: 139 mmol/L (ref 135–145)

## 2023-10-02 LAB — CBC
HCT: 41.1 % (ref 36.0–46.0)
Hemoglobin: 13.4 g/dL (ref 12.0–15.0)
MCH: 31.2 pg (ref 26.0–34.0)
MCHC: 32.6 g/dL (ref 30.0–36.0)
MCV: 95.8 fL (ref 80.0–100.0)
Platelets: 205 10*3/uL (ref 150–400)
RBC: 4.29 MIL/uL (ref 3.87–5.11)
RDW: 13.4 % (ref 11.5–15.5)
WBC: 4.4 10*3/uL (ref 4.0–10.5)
nRBC: 0 % (ref 0.0–0.2)

## 2023-10-02 LAB — TROPONIN I (HIGH SENSITIVITY): Troponin I (High Sensitivity): 3 ng/L (ref <18)

## 2023-10-02 MED ORDER — CYCLOBENZAPRINE HCL 5 MG PO TABS: 5.0000 mg | ORAL_TABLET | Freq: Three times a day (TID) | ORAL | 0 refills | Status: AC | PRN

## 2023-10-02 NOTE — Discharge Instructions (Addendum)
 As we discussed please use Benadryl 50 mg every 6 hours for itching/swelling.  Return to the emergency department immediately for any worsening swelling, any sensation of swelling in the mouth or throat, trouble breathing or any other symptom personally concerning to yourself.  Otherwise please follow-up with your doctor in the next 2 days for recheck/reevaluation.

## 2023-10-02 NOTE — ED Provider Notes (Signed)
 [facility] Provider Note    Event Date/Time   First MD Initiated Contact with Patient 10/02/23 [phone]     (approximate)  History   Chief Complaint: Back Pain  HPI  Marcia Rodriguez is a 63 y.o. female with a past medical history of hypertension who presents to the emergency department for back pain and facial swelling.  According to the patient for the last 2 days now she has been experiencing some pain in her upper back, patient points to her bilateral trapezius muscles.  States she was lifting heavier weight several days ago but is not sure if this caused a muscle soreness.  Patient states she woke this morning and had facial swelling so she came to the emergency department for evaluation.  Patient states her face is also itching especially around her eyes.  No anterior chest pain, no shortness of breath no pleuritic pain.  Physical Exam   Triage Vital Signs: ED Triage Vitals  Encounter Vitals Group     BP 10/02/23 0542 138/63     Systolic BP Percentile --      Diastolic BP Percentile --      Pulse Rate 10/02/23 0542 71     Resp 10/02/23 0542 18     Temp 10/02/23 0542 98.3 F (36.8 C)     Temp Source 10/02/23 0542 Oral     SpO2 10/02/23 0542 97 %     Weight 10/02/23 0541 170 lb (77.1 kg)     Height 10/02/23 0541 5' 4 (1.626 m)     Head Circumference --      Peak Flow --      Pain Score 10/02/23 0541 5     Pain Loc --      Pain Education --      Exclude from Growth Chart --     Most recent vital signs: Vitals:   10/02/23 0542  BP: 138/63  Pulse: 71  Resp: 18  Temp: 98.3 F (36.8 C)  SpO2: 97%    General: Awake, no distress.  CV:  Good peripheral perfusion.  Regular rate and rhythm  Resp:  Normal effort.  Equal breath sounds bilaterally.  Abd:  No distention.  Soft, nontender.  No rebound or guarding. Other:  Minimal periorbital edema mild erythema to these areas.  Mild tenderness along the patient's bilateral trapezius muscles.   ED  Results / Procedures / Treatments   EKG  EKG viewed and interpreted by myself shows a normal sinus rhythm at 71 bpm with a narrow QRS, normal axis, normal intervals, no concerning ST changes.  RADIOLOGY  I have reviewed and interpreted the chest x-ray images.  No consolidation seen on my evaluation. Radiology is read the x-ray is negative   MEDICATIONS ORDERED IN ED: Medications - No data to display   IMPRESSION / MDM / ASSESSMENT AND PLAN / ED COURSE  I reviewed the triage vital signs and the nursing notes.  Patient's presentation is most consistent with acute presentation with potential threat to life or bodily function.  Patient presents to the emergency department for muscular back pain as well as facial swelling.  Patient has minimal periorbital edema states she has been itching her eyes this morning as well.  Highly suspect more allergies causing her minimal edema.  Will obtain a chest x-ray as a precaution.  Remainder of the lab work is reassuring including a normal CBC chemistry and a negative troponin.  Reassuring EKG.  Chest x-ray shows no  concerning findings.  Will treat with Benadryl have the patient follow-up with her doctor.  Discussed return precautions.  FINAL CLINICAL IMPRESSION(S) / ED DIAGNOSES   Musculoskeletal pain Allergies    Note:  This document was prepared using Dragon voice recognition software and may include unintentional dictation errors.   Dorothyann Drivers, MD 10/02/23 [phone]

## 2023-10-02 NOTE — ED Triage Notes (Signed)
 Patient ambulatory to triage with steady gait, without difficulty or distress noted; pt reports for last wk having upper back/shoulder pain with no accomp symptoms; awoke this morning with swelling to face; denies hx of same

## 2024-02-04 ENCOUNTER — Other Ambulatory Visit: Payer: Self-pay | Admitting: Emergency Medicine

## 2024-02-04 DIAGNOSIS — Z122 Encounter for screening for malignant neoplasm of respiratory organs: Secondary | ICD-10-CM

## 2024-02-04 DIAGNOSIS — Z87891 Personal history of nicotine dependence: Secondary | ICD-10-CM

## 2024-02-04 DIAGNOSIS — J432 Centrilobular emphysema: Secondary | ICD-10-CM

## 2024-02-08 ENCOUNTER — Ambulatory Visit
Admission: RE | Admit: 2024-02-08 | Discharge: 2024-02-08 | Disposition: A | Discharge status: Home / Self Care | Source: Ambulatory Visit | Attending: Emergency Medicine | Admitting: Emergency Medicine

## 2024-02-08 DIAGNOSIS — Z122 Encounter for screening for malignant neoplasm of respiratory organs: Secondary | ICD-10-CM | POA: Insufficient documentation

## 2024-02-08 DIAGNOSIS — J432 Centrilobular emphysema: Secondary | ICD-10-CM | POA: Diagnosis present

## 2024-02-08 DIAGNOSIS — Z87891 Personal history of nicotine dependence: Secondary | ICD-10-CM | POA: Insufficient documentation
